# Patient Record
Sex: Male | Born: 1980 | Race: White | Hispanic: No | Marital: Single
Health system: Southern US, Community
[De-identification: ages and names within clinical notes are randomized; demographics above are authoritative.]

## PROBLEM LIST (undated history)

## (undated) DIAGNOSIS — F1010 Alcohol abuse, uncomplicated: Secondary | ICD-10-CM

---

## 2021-05-31 ENCOUNTER — Observation Stay (HOSPITAL_COMMUNITY)
Admission: EM | Admit: 2021-05-31 | Discharge: 2021-06-01 | Disposition: A | Discharge status: Home / Self Care | Attending: Family Medicine | Admitting: Family Medicine

## 2021-05-31 ENCOUNTER — Other Ambulatory Visit: Payer: Self-pay

## 2021-05-31 ENCOUNTER — Emergency Department (HOSPITAL_COMMUNITY)

## 2021-05-31 ENCOUNTER — Encounter (HOSPITAL_COMMUNITY): Payer: Self-pay

## 2021-05-31 DIAGNOSIS — R410 Disorientation, unspecified: Secondary | ICD-10-CM | POA: Diagnosis not present

## 2021-05-31 DIAGNOSIS — W1839XA Other fall on same level, initial encounter: Secondary | ICD-10-CM | POA: Insufficient documentation

## 2021-05-31 DIAGNOSIS — Y92143 Cell of prison as the place of occurrence of the external cause: Secondary | ICD-10-CM | POA: Diagnosis not present

## 2021-05-31 DIAGNOSIS — E876 Hypokalemia: Secondary | ICD-10-CM | POA: Insufficient documentation

## 2021-05-31 DIAGNOSIS — R000 Tachycardia, unspecified: Secondary | ICD-10-CM | POA: Diagnosis not present

## 2021-05-31 DIAGNOSIS — S066X0A Traumatic subarachnoid hemorrhage without loss of consciousness, initial encounter: Principal | ICD-10-CM | POA: Insufficient documentation

## 2021-05-31 DIAGNOSIS — I609 Nontraumatic subarachnoid hemorrhage, unspecified: Secondary | ICD-10-CM | POA: Diagnosis not present

## 2021-05-31 DIAGNOSIS — Z23 Encounter for immunization: Secondary | ICD-10-CM | POA: Insufficient documentation

## 2021-05-31 DIAGNOSIS — F10239 Alcohol dependence with withdrawal, unspecified: Secondary | ICD-10-CM | POA: Diagnosis present

## 2021-05-31 DIAGNOSIS — F10939 Alcohol use, unspecified with withdrawal, unspecified: Secondary | ICD-10-CM | POA: Diagnosis present

## 2021-05-31 DIAGNOSIS — S0990XA Unspecified injury of head, initial encounter: Secondary | ICD-10-CM | POA: Diagnosis present

## 2021-05-31 DIAGNOSIS — F10230 Alcohol dependence with withdrawal, uncomplicated: Secondary | ICD-10-CM

## 2021-05-31 DIAGNOSIS — F1010 Alcohol abuse, uncomplicated: Secondary | ICD-10-CM | POA: Diagnosis present

## 2021-05-31 DIAGNOSIS — F10139 Alcohol abuse with withdrawal, unspecified: Secondary | ICD-10-CM | POA: Insufficient documentation

## 2021-05-31 DIAGNOSIS — Y900 Blood alcohol level of less than 20 mg/100 ml: Secondary | ICD-10-CM | POA: Insufficient documentation

## 2021-05-31 DIAGNOSIS — E669 Obesity, unspecified: Secondary | ICD-10-CM | POA: Insufficient documentation

## 2021-05-31 DIAGNOSIS — Z20822 Contact with and (suspected) exposure to covid-19: Secondary | ICD-10-CM | POA: Diagnosis not present

## 2021-05-31 DIAGNOSIS — F10930 Alcohol use, unspecified with withdrawal, uncomplicated: Secondary | ICD-10-CM

## 2021-05-31 HISTORY — DX: Alcohol abuse, uncomplicated: F10.10

## 2021-05-31 LAB — COMPREHENSIVE METABOLIC PANEL
ALT: 94 U/L — ABNORMAL HIGH (ref 0–44)
AST: 107 U/L — ABNORMAL HIGH (ref 15–41)
Albumin: 4.4 g/dL (ref 3.5–5.0)
Alkaline Phosphatase: 72 U/L (ref 38–126)
Anion gap: 11 (ref 5–15)
BUN: 5 mg/dL — ABNORMAL LOW (ref 6–20)
CO2: 26 mmol/L (ref 22–32)
Calcium: 8.3 mg/dL — ABNORMAL LOW (ref 8.9–10.3)
Chloride: 101 mmol/L (ref 98–111)
Creatinine, Ser: 0.47 mg/dL — ABNORMAL LOW (ref 0.61–1.24)
GFR, Estimated: >60 mL/min (ref >60)
Glucose, Bld: 115 mg/dL — ABNORMAL HIGH (ref 70–99)
Potassium: 3.3 mmol/L — ABNORMAL LOW (ref 3.5–5.1)
Sodium: 138 mmol/L (ref 135–145)
Total Bilirubin: 1.3 mg/dL — ABNORMAL HIGH (ref 0.3–1.2)
Total Protein: 7.7 g/dL (ref 6.5–8.1)

## 2021-05-31 LAB — CBC WITH DIFFERENTIAL/PLATELET
Abs Immature Granulocytes: 0.02 10*3/uL (ref 0.00–0.07)
Basophils Absolute: 0.1 10*3/uL (ref 0.0–0.1)
Basophils Relative: 1 %
Eosinophils Absolute: 0 10*3/uL (ref 0.0–0.5)
Eosinophils Relative: 0 %
HCT: 39.3 % (ref 39.0–52.0)
Hemoglobin: 14 g/dL (ref 13.0–17.0)
Immature Granulocytes: 1 %
Lymphocytes Relative: 16 %
Lymphs Abs: 0.7 10*3/uL (ref 0.7–4.0)
MCH: 32.9 pg (ref 26.0–34.0)
MCHC: 35.6 g/dL (ref 30.0–36.0)
MCV: 92.5 fL (ref 80.0–100.0)
Monocytes Absolute: 0.6 10*3/uL (ref 0.1–1.0)
Monocytes Relative: 14 %
Neutro Abs: 2.9 10*3/uL (ref 1.7–7.7)
Neutrophils Relative %: 68 %
Platelets: 116 10*3/uL — ABNORMAL LOW (ref 150–400)
RBC: 4.25 MIL/uL (ref 4.22–5.81)
RDW: 13 % (ref 11.5–15.5)
WBC: 4.2 10*3/uL (ref 4.0–10.5)
nRBC: 0 % (ref 0.0–0.2)

## 2021-05-31 LAB — RESP PANEL BY RT-PCR (FLU A&B, COVID) ARPGX2
Influenza A by PCR: NEGATIVE
Influenza B by PCR: NEGATIVE
SARS Coronavirus 2 by RT PCR: NEGATIVE

## 2021-05-31 LAB — ETHANOL: Alcohol, Ethyl (B): <10 mg/dL (ref <10)

## 2021-05-31 LAB — MAGNESIUM: Magnesium: 1 mg/dL — ABNORMAL LOW (ref 1.7–2.4)

## 2021-05-31 LAB — PROTIME-INR
INR: 1 (ref 0.8–1.2)
Prothrombin Time: 13.2 seconds (ref 11.4–15.2)

## 2021-05-31 MED ORDER — POTASSIUM CHLORIDE IN NACL 20-0.9 MEQ/L-% IV SOLN
INTRAVENOUS | Status: DC
  Filled 2021-05-31: qty 1000

## 2021-05-31 MED ORDER — LORAZEPAM 1 MG PO TABS: 1.0000 mg | ORAL_TABLET | ORAL | Status: DC | PRN

## 2021-05-31 MED ORDER — THIAMINE HCL 100 MG/ML IJ SOLN: 100.0000 mg | Freq: Every day | INTRAMUSCULAR | Status: DC

## 2021-05-31 MED ORDER — ACETAMINOPHEN 650 MG RE SUPP: 650.0000 mg | Freq: Four times a day (QID) | RECTAL | Status: DC | PRN

## 2021-05-31 MED ORDER — FOLIC ACID 1 MG PO TABS
1.0000 mg | ORAL_TABLET | Freq: Every day | ORAL | Status: DC
  Administered 2021-06-01: 1 mg via ORAL
  Filled 2021-05-31: qty 1

## 2021-05-31 MED ORDER — THIAMINE HCL 100 MG PO TABS
100.0000 mg | ORAL_TABLET | Freq: Every day | ORAL | Status: DC
  Administered 2021-06-01: 100 mg via ORAL
  Filled 2021-05-31: qty 1

## 2021-05-31 MED ORDER — LORAZEPAM 2 MG/ML IJ SOLN
1.0000 mg | INTRAMUSCULAR | Status: DC | PRN
  Administered 2021-06-01: 2 mg via INTRAVENOUS
  Administered 2021-06-01: 4 mg via INTRAVENOUS
  Administered 2021-06-01 (×2): 2 mg via INTRAVENOUS
  Filled 2021-05-31: qty 2
  Filled 2021-05-31: qty 1

## 2021-05-31 MED ORDER — LORAZEPAM 2 MG/ML IJ SOLN
1.0000 mg | Freq: Once | INTRAMUSCULAR | Status: AC
  Administered 2021-05-31: 1 mg via INTRAVENOUS
  Filled 2021-05-31: qty 1

## 2021-05-31 MED ORDER — LORAZEPAM 2 MG/ML IJ SOLN: 0.0000 mg | Freq: Three times a day (TID) | INTRAMUSCULAR | Status: DC

## 2021-05-31 MED ORDER — TETANUS-DIPHTH-ACELL PERTUSSIS 5-2.5-18.5 LF-MCG/0.5 IM SUSY
0.5000 mL | PREFILLED_SYRINGE | Freq: Once | INTRAMUSCULAR | Status: AC
  Administered 2021-05-31: 0.5 mL via INTRAMUSCULAR
  Filled 2021-05-31: qty 0.5

## 2021-05-31 MED ORDER — ACETAMINOPHEN 325 MG PO TABS: 650.0000 mg | ORAL_TABLET | Freq: Four times a day (QID) | ORAL | Status: DC | PRN

## 2021-05-31 MED ORDER — LORAZEPAM 2 MG/ML IJ SOLN
0.0000 mg | INTRAMUSCULAR | Status: DC
  Administered 2021-05-31: 1 mg via INTRAVENOUS
  Filled 2021-05-31 (×3): qty 1

## 2021-05-31 MED ORDER — ADULT MULTIVITAMIN W/MINERALS CH
1.0000 | ORAL_TABLET | Freq: Every day | ORAL | Status: DC
  Administered 2021-06-01: 1 via ORAL
  Filled 2021-05-31: qty 1

## 2021-05-31 MED ORDER — ONDANSETRON HCL 4 MG PO TABS: 4.0000 mg | ORAL_TABLET | Freq: Four times a day (QID) | ORAL | Status: DC | PRN

## 2021-05-31 MED ORDER — POTASSIUM CHLORIDE IN NACL 20-0.9 MEQ/L-% IV SOLN: INTRAVENOUS | Status: DC

## 2021-05-31 MED ORDER — ONDANSETRON HCL 4 MG/2ML IJ SOLN: 4.0000 mg | Freq: Four times a day (QID) | INTRAMUSCULAR | Status: DC | PRN

## 2021-05-31 MED ORDER — POTASSIUM CHLORIDE CRYS ER 20 MEQ PO TBCR
40.0000 meq | EXTENDED_RELEASE_TABLET | Freq: Once | ORAL | Status: AC
  Administered 2021-05-31: 40 meq via ORAL
  Filled 2021-05-31: qty 2

## 2021-05-31 MED ORDER — SODIUM CHLORIDE 0.9 % IV BOLUS
500.0000 mL | Freq: Once | INTRAVENOUS | Status: AC
  Administered 2021-05-31: 500 mL via INTRAVENOUS

## 2021-05-31 NOTE — ED Triage Notes (Signed)
Brought in from [facility] by [facility] for fall from standing height. Nurse at jail reports brief loss of consciousness, but is now alert and oriented x 4. On arrival to ED bleeding controlled by bandage applied by EMS, pt is alert and oriented but tremulous. Pt reports drinking five 24 ounce beers daily with last drink 05/30/2021 at 1700.

## 2021-05-31 NOTE — H&P (Signed)
History and Physical    Stanley Salazar YWV:[id number] DOB: August 02, 1981 DOA: 05/31/2021  PCP: Pcp, No   Patient coming from: Detention center.  I have personally briefly reviewed patient's old medical records in [facility]  Chief Complaint: Fall.  HPI: Stanley Salazar is a 40 y.o. male with medical history significant of of alcohol abuse.  He drinks beer 3 to 6 cans of beer of 24 ounces on weekdays and may be twice as much over the weekends.  Apparently he was arrested for DWI yesterday and taken to the detention center where he had a fall earlier in the evening hitting the back of his head and developing a subarachnoid bleed.  The patient has not had alcohol in over 24 hours, is tremulous, confused about time and date and restless.  He is unable to provide further history.  ED Course: Initial vital signs were 99.7 F, pulse 104, respirations 20, BP 174/107 mmHg O2 sat 100% on room air.  The patient received 1 mg of lorazepam, 500 mL NS bolus, a Tdap booster.  I added lorazepam 2 mg IVP x1, magnesium sulfate 4 g IVPB, metoprolol 5 mg IVP x1 and 5mg  haloperidol IVP times  Lab work: His CBC showed white count 4.2, hemoglobin 14.0 g/dL platelets .  PT was 13.2 INR 1.0.  CMP showed a potassium of 3.3 mmol/L, normal sodium, chloride and CO2.  Calcium 8.3, glucose 115, BUN 5 and creatinine 0.47 mg/dL.  LFTs show an AST of 107 and ALT of 94 units/L.  Total bilirubin was 1.3 mg/dL.  The rest of the hepatic functions were normal.  Alcohol level was less than 10 mg/dL.  Imaging: CT cervical spine did not show any fractures.  CT head with contrast showed a left parietal hematoma and frontal area subarachnoid hemorrhage.  Please see images and phonated report for further detail.  Review of Systems: As per HPI otherwise all other systems reviewed and are negative.  History reviewed. No pertinent past medical history.  History reviewed. No pertinent surgical history.  Social History  reports current  alcohol use of about 5.0 standard drinks of alcohol per week. No history on file for tobacco use and drug use.  No Known Allergies  Family history. Stated he has some relatives with hypertension and diabetes.  Prior to Admission medications   Not on File    Physical Exam: Vitals:   05/31/21 1952 05/31/21 2046 05/31/21 2200 05/31/21 2207  BP:  (!) 147/107 (!) 163/97 (!) 163/97  Pulse:  (!) 104 76 76  Resp:   (!) 22   Temp:      TempSrc:      SpO2:   100%   Weight: 81.6 kg     Height: 5\' 2"  (1.575 m)      Constitutional: Moderately restless and anxious. Eyes: PERRL, lids and conjunctivae normal.  Sclerae is injected. ENMT: Mucous membranes are moist. Posterior pharynx clear of any exudate or lesions. Neck: normal, supple, no masses, no thyromegaly Respiratory: clear to auscultation bilaterally, no wheezing, no crackles. Normal respiratory effort. No accessory muscle use.  Cardiovascular: Sinus tachycardia in the 110s, no murmurs / rubs / gallops. No extremity edema. 2+ pedal pulses. No carotid bruits.  Abdomen: Obese, no distention.  Bowel sounds positive. Soft, no tenderness, no masses palpated. No hepatosplenomegaly.  Musculoskeletal: no clubbing / cyanosis.  Good ROM, no contractures. Normal muscle tone.  Skin: Left parietal hematoma with 3 to 4 cm long laceration. Neurologic: Restless and tremulous, positive tongue  fasciculations, grossly nonfocal. Psychiatric: Oriented x2, partially disoriented to time/date and situation.  Labs on Admission: I have personally reviewed following labs and imaging studies  CBC: Recent Labs  Lab 05/31/21 2105  WBC 4.2  NEUTROABS 2.9  HGB 14.0  HCT 39.3  MCV 92.5  PLT 116*    Basic Metabolic Panel: Recent Labs  Lab 05/31/21 2105  NA 138  K 3.3*  CL 101  CO2 26  GLUCOSE 115*  BUN 5*  CREATININE 0.47*  CALCIUM 8.3*  MG 1.0*    GFR: Estimated Creatinine Clearance: 113.5 mL/min (A) (by C-G formula based on SCr of 0.47 mg/dL  (L)).  Liver Function Tests: Recent Labs  Lab 05/31/21 2105  AST 107*  ALT 94*  ALKPHOS 72  BILITOT 1.3*  PROT 7.7  ALBUMIN 4.4    Urine analysis: No results found for: COLORURINE, APPEARANCEUR, LABSPEC, PHURINE, GLUCOSEU, HGBUR, BILIRUBINUR, KETONESUR, PROTEINUR, UROBILINOGEN, NITRITE, LEUKOCYTESUR  Radiological Exams on Admission: CT HEAD WO CONTRAST ( )  Result Date: 05/31/2021 CLINICAL DATA:  Fall EXAM: CT HEAD WITHOUT CONTRAST TECHNIQUE: Contiguous axial images were obtained from the base of the skull through the vertex without intravenous contrast. COMPARISON:  None. FINDINGS: Brain: There is small amount of subarachnoid blood noted medially in both frontal lobes adjacent to the falx, best seen on axial image 21, series 2 and coronal image 33, series 4. No mass effect or midline shift. No hydrocephalus. Vascular: No hyperdense vessel or unexpected calcification. Skull: No acute calvarial abnormality. Sinuses/Orbits: No acute findings Other: Large hematoma over the left parietal region. IMPRESSION: Small amount of subarachnoid blood within the medial frontal lobes bilaterally. Critical Value/emergent results were called by telephone at the time of interpretation on 05/31/2021 at 8:49 pm to provider JOSEPH ZAMMIT , who verbally acknowledged these results. Electronically Signed   By: Charlett Nose M.D.   On: 05/31/2021 20:50   CT Cervical Spine Wo Contrast  Result Date: 05/31/2021 CLINICAL DATA:  Neck trauma, dangerous injury mechanism (Age 72-64y). Fall. EXAM: CT CERVICAL SPINE WITHOUT CONTRAST TECHNIQUE: Multidetector CT imaging of the cervical spine was performed without intravenous contrast. Multiplanar CT image reconstructions were also generated. COMPARISON:  None. FINDINGS: Alignment: Normal Skull base and vertebrae: No acute fracture. No primary bone lesion or focal pathologic process. Soft tissues and spinal canal: No prevertebral fluid or swelling. No visible canal hematoma. Disc  levels:  Normal Upper chest: Negative Other: None IMPRESSION: No bony abnormality. Electronically Signed   By: Charlett Nose M.D.   On: 05/31/2021 20:52    EKG: Independently reviewed.  Vent. rate 76 BPM PR interval 142 ms QRS duration 88 ms QT/QTcB 402/452 ms P-R-T axes 69 64 35 Normal sinus rhythm Normal ECG  Assessment/Plan Principal Problem:   Subarachnoid bleed (HCC) Observation/stepdown. Neurochecks every 2 hours. Repeat CT head in a.m. Neurosurgery will compared to yesterday's CT.  Active Problems:   Alcohol abuse   Alcohol withdrawal (HCC) Begin CIWA protocol. Magnesium sulfate given. Continue thiamine, MVI and folate. Consult TOC team.    Hypokalemia Replacing. Magnesium was supplemented. Follow-up potassium level.    Hypomagnesemia Secondary to heavy alcohol consumption. Magnesium sulfate 4 g IVPB x1 dose infused.    Class 1 obesity Lifestyle modifications. Needs PCP for future follow-up.   DVT prophylaxis: SCDs. Code Status:   Full code. Family Communication:   Disposition Plan:   Patient is from:  Detention center.  Anticipated DC to:  TBD.  Anticipated DC date:  06/02/2021 or 06/03/2021.  Anticipated DC barriers:  Clinical status. Consults called:   Admission status:  Observation/stepdown.  Severity of Illness:  High severity after presenting with history of fall that resulted on a subarachnoid bleed and external left parietal hematoma.  The patient is also having alcohol withdrawal with confusion.  He will remain for close neurological monitoring, repeat head CT in a.m., electrolyte/nutrient replacement and alcohol detoxification.  Bobette Mo MD [facility]  How to contact the [facility] Attending or Consulting provider 7A - 7P or covering provider during after hours 7P -7A, for this patient?   Check the care team in [facility] and look for a) attending/consulting [facility] provider listed and b) the [facility] team listed Log into [email or web address] and use  [place]'s universal password to access. If you do not have the password, please contact the hospital operator. Locate the [facility] provider you are looking for under [facility] and page to a number that you can be directly reached. If you still have difficulty reaching the provider, please page the [facility] (Director on Call) for the Hospitalists listed on amion for assistance.  05/31/2021, 11:31 PM   This document was prepared using Dragon voice recognition software and may contain some unintended transcription errors.

## 2021-05-31 NOTE — ED Provider Notes (Signed)
[facility] EMERGENCY DEPARTMENT Provider Note   CSN: [id number] Arrival date & time: 05/31/21  1934     History No chief complaint on file.   Stanley Salazar is a 40 y.o. male.  HPI  HPI was collected while using Spanish interpreter.  Patient with no significant medical history presents to the emergency department via [facility] jail due to a fall.  Patient states he was standing in his jail cell and passed out, he states he struck the left side of his head is now having a slight headache but denies change in vision, paresthesias or weakness in the upper and or lower extremities, denies lightheaded or dizziness, nausea or vomiting.  Patient states he is unsure whether he lost conscious, he is not on anticoagulant, he denies feeling chest pain, shortness of breath, lightheaded or dizziness prior to passing out.  He states he is never passed out in the past.  He endorses that he drinks 3-24 ounce cans of alcohol daily, states his last drink was yesterday, he states he feels slightly anxious, but denies homicidal/ suicidal ideations, denies hallucination delusions.  He denies  neck pain, back pain, chest pain, abdominal pain, has no other complaints at this time.    History reviewed. No pertinent past medical history.  Patient Active Problem List   Diagnosis Date Noted   Subarachnoid bleed (HCC) 05/31/2021   Alcohol withdrawal (HCC) 05/31/2021    History reviewed. No pertinent surgical history.     History reviewed. No pertinent family history.  Social History   Tobacco Use   Smoking status: Unknown  Substance Use Topics   Alcohol use: Yes    Alcohol/week: 5.0 standard drinks    Types: 5 Cans of beer per week    Comment: 5- 24 oucnc beers/day    Home Medications Prior to Admission medications   Not on File    Allergies    Patient has no known allergies.  Review of Systems   Review of Systems  Constitutional:  Negative for chills and fever.  HENT:  Negative  for congestion.   Eyes:  Negative for visual disturbance.  Respiratory:  Negative for shortness of breath.   Cardiovascular:  Negative for chest pain.  Gastrointestinal:  Negative for abdominal pain.  Genitourinary:  Negative for enuresis.  Musculoskeletal:  Negative for back pain.  Skin:  Positive for wound. Negative for rash.  Neurological:  Positive for headaches. Negative for dizziness.  Hematological:  Does not bruise/bleed easily.  Psychiatric/Behavioral:  The patient is nervous/anxious.    Physical Exam Updated Vital Signs BP (!) 163/97   Pulse 76   Temp 99.7 F (37.6 C) (Oral)   Resp (!) 22   Ht 5\' 2"  (1.575 m)   Wt 81.6 kg   SpO2 100%   BMI 32.92 kg/m   Physical Exam Vitals and nursing note reviewed.  Constitutional:      General: He is not in acute distress.    Appearance: He is not ill-appearing.  HENT:     Head: Normocephalic and atraumatic.     Comments: Head was visualized he has a noted hematoma on the left occipital lobe, he has a 4 cm laceration, hemodynamically stable, approximately 2 mm in depth no foreign bodies present.  No other gross abnormalities present.    Nose: No congestion.  Eyes:     Extraocular Movements: Extraocular movements intact.     Conjunctiva/sclera: Conjunctivae normal.     Pupils: Pupils are equal, round, and reactive to light.  Cardiovascular:     Rate and Rhythm: Normal rate and regular rhythm.     Pulses: Normal pulses.     Heart sounds: No murmur heard.   No friction rub. No gallop.  Pulmonary:     Effort: No respiratory distress.     Breath sounds: No wheezing, rhonchi or rales.  Chest:     Chest wall: No tenderness.  Abdominal:     Palpations: Abdomen is soft.     Tenderness: There is no abdominal tenderness.  Musculoskeletal:     Cervical back: Neck supple.     Comments: Patient has 5 of 5 strength, full range of motion in the upper and lower extremities.  Spine was palpated is nontender to palpation, no step-off  or deformities present.  Skin:    General: Skin is warm and dry.  Neurological:     Mental Status: He is alert.     GCS: GCS eye subscore is 4. GCS verbal subscore is 5. GCS motor subscore is 6.     Motor: Motor function is intact. No weakness.     Comments: Cranial nerves II through XII grossly intact patient is having no difficulty word finding, no slurring of his words, able to follow two-step commands, no unilateral weakness present my exam.  It is noted that patient has bilateral tremors likely from alcohol withdrawals.  Psychiatric:        Mood and Affect: Mood normal.    ED Results / Procedures / Treatments   Labs (all labs ordered are listed, but only abnormal results are displayed) Labs Reviewed  CBC WITH DIFFERENTIAL/PLATELET - Abnormal; Notable for the following components:      Result Value   Platelets 116 (*)    All other components within normal limits  COMPREHENSIVE METABOLIC PANEL - Abnormal; Notable for the following components:   Potassium 3.3 (*)    Glucose, Bld 115 (*)    BUN 5 (*)    Creatinine, Ser 0.47 (*)    Calcium 8.3 (*)    AST 107 (*)    ALT 94 (*)    Total Bilirubin 1.3 (*)    All other components within normal limits  RESP PANEL BY RT-PCR (FLU A&B, COVID) ARPGX2  ETHANOL  PROTIME-INR  COMPREHENSIVE METABOLIC PANEL  MAGNESIUM  PHOSPHORUS  HIV ANTIBODY (ROUTINE TESTING W REFLEX)    EKG None  Radiology CT HEAD WO CONTRAST (5MM)  Result Date: 05/31/2021 CLINICAL DATA:  Fall EXAM: CT HEAD WITHOUT CONTRAST TECHNIQUE: Contiguous axial images were obtained from the base of the skull through the vertex without intravenous contrast. COMPARISON:  None. FINDINGS: Brain: There is small amount of subarachnoid blood noted medially in both frontal lobes adjacent to the falx, best seen on axial image 21, series 2 and coronal image 33, series 4. No mass effect or midline shift. No hydrocephalus. Vascular: No hyperdense vessel or unexpected calcification.  Skull: No acute calvarial abnormality. Sinuses/Orbits: No acute findings Other: Large hematoma over the left parietal region. IMPRESSION: Small amount of subarachnoid blood within the medial frontal lobes bilaterally. Critical Value/emergent results were called by telephone at the time of interpretation on 05/31/2021 at 8:49 pm to provider JOSEPH ZAMMIT , who verbally acknowledged these results. Electronically Signed   By: Charlett NoseKevin  Dover M.D.   On: 05/31/2021 20:50   CT Cervical Spine Wo Contrast  Result Date: 05/31/2021 CLINICAL DATA:  Neck trauma, dangerous injury mechanism (Age 2-64y). Fall. EXAM: CT CERVICAL SPINE WITHOUT CONTRAST TECHNIQUE: Multidetector CT imaging of  the cervical spine was performed without intravenous contrast. Multiplanar CT image reconstructions were also generated. COMPARISON:  None. FINDINGS: Alignment: Normal Skull base and vertebrae: No acute fracture. No primary bone lesion or focal pathologic process. Soft tissues and spinal canal: No prevertebral fluid or swelling. No visible canal hematoma. Disc levels:  Normal Upper chest: Negative Other: None IMPRESSION: No bony abnormality. Electronically Signed   By: Charlett Nose M.D.   On: 05/31/2021 20:52    Procedures .Marland KitchenLaceration Repair  Date/Time: 05/31/2021 11:01 PM Performed by: Carroll Sage, PA-C Authorized by: Carroll Sage, PA-C   Consent:    Consent obtained:  Verbal   Consent given by:  Patient   Risks discussed:  Infection, pain, retained foreign body, need for additional repair, poor cosmetic result, tendon damage, vascular damage, poor wound healing and nerve damage   Alternatives discussed:  No treatment Universal protocol:    Patient identity confirmed:  Verbally with patient Anesthesia:    Anesthesia method:  None Laceration details:    Location:  Scalp   Scalp location:  Occipital   Length (cm):  4   Depth (mm):  2 Pre-procedure details:    Preparation:  Patient was prepped and draped in  usual sterile fashion Exploration:    Limited defect created (wound extended): no     Hemostasis achieved with:  Direct pressure   Wound exploration: wound explored through full range of motion and entire depth of wound visualized     Contaminated: no   Treatment:    Amount of cleaning:  Standard   Irrigation solution:  Sterile saline   Irrigation method:  Pressure wash   Visualized foreign bodies/material removed: no     Debridement:  None Skin repair:    Repair method:  Staples   Number of staples:  2 Approximation:    Approximation:  Close Repair type:    Repair type:  Simple Post-procedure details:    Dressing:  Non-adherent dressing   Procedure completion:  Tolerated well, no immediate complications   Medications Ordered in ED Medications  Tdap (BOOSTRIX) injection 0.5 mL (has no administration in time range)  LORazepam (ATIVAN) tablet 1-4 mg (has no administration in time range)    Or  LORazepam (ATIVAN) injection 1-4 mg (has no administration in time range)  thiamine tablet 100 mg (has no administration in time range)    Or  thiamine (B-1) injection 100 mg (has no administration in time range)  folic acid (FOLVITE) tablet 1 mg (has no administration in time range)  multivitamin with minerals tablet 1 tablet (has no administration in time range)  LORazepam (ATIVAN) injection 0-4 mg (has no administration in time range)    Followed by  LORazepam (ATIVAN) injection 0-4 mg (has no administration in time range)  acetaminophen (TYLENOL) tablet 650 mg (has no administration in time range)    Or  acetaminophen (TYLENOL) suppository 650 mg (has no administration in time range)  ondansetron (ZOFRAN) tablet 4 mg (has no administration in time range)    Or  ondansetron (ZOFRAN) injection 4 mg (has no administration in time range)  potassium chloride SA (KLOR-CON) CR tablet 40 mEq (has no administration in time range)  0.9 % NaCl with KCl 20 mEq/ L  infusion (has no  administration in time range)  sodium chloride 0.9 % bolus 500 mL (0 mLs Intravenous Stopped 05/31/21 2205)  LORazepam (ATIVAN) injection 1 mg (1 mg Intravenous Given 05/31/21 2108)    ED Course  I have reviewed  the triage vital signs and the nursing notes.  Pertinent labs & imaging results that were available during my care of the patient were reviewed by me and considered in my medical decision making (see chart for details).    MDM Rules/Calculators/A&P                          Initial impression-patient presents after a fall, he is alert, does not appear to be in acute distress, vital signs show tachycardia, concern for head injury as well as alcohol withdrawals will obtain lab work initiate CIWA continue to evaluate.  Work-up-CBC unremarkable, CMP shows slight hypokalemia 3.3, hyperglycemia 115, elevated liver enzymes AST 100, ALT 94, T bili 1.3 INR unremarkable, CT head reveals small amount of subarachnoid blood within the medial frontal lobes bilaterally, CT C-spine negative for acute findings.  Reassessment-patient initial CIWA of 19, given 1 mg of Ativan, patient was reassessed tachycardia has improved but continues have bilateral tremors.  Notified that patient has subarachnoid bleed will consult with neurosurgery for further evaluation.  Updated patient on recommendation from neurosurgery, he is in agreement with this plan, will consult hospitalist for admission.  Inform patient that he has a small laceration on his head will recommend staples for improved wound healing.  Patient agreeable this plan.  Patient tolerated procedure well.  Consult   1.  Spoke with Dr. Franky Macho of neurosurgery, he recommends observation, repeat CT head, notify him if there are new neurodeficits or acute findings seen on imaging.  2.  Spoke with Dr. Robb Matar who will admit the patient.  Rule out-low suspicion for CVA as there is no neurodeficits present my exam.  Low suspicion for spinal cord abnormality  or spinal cord the spine was palpated was nontender to palpation, patient has full range of motion in the upper and lower extremities.  Low suspicion for pneumothorax or rib fracture as ribs are nontender to palpation, lung sounds are clear bilaterally, will defer imaging at this time.  Low suspicion for intra-abdominal trauma as abdomen soft nontender to palpation.  It is noted that patient has an elevated liver enzymes as well as slight elevated T bili he has no right upper quadrant tenderness, I doubt gallbladder abnormality or liver cirrhosis.  Likely this is secondary due to alcohol consumption.  Plan-admit due to subarachnoid hemorrhage as well as uncomplicated alcohol withdrawals.  Final Clinical Impression(s) / ED Diagnoses Final diagnoses:  Subarachnoid bleed (HCC)  Alcohol withdrawal syndrome without complication [facility])    Rx / DC Orders ED Discharge Orders     None        Barnie Del 05/31/21 2303    Bethann Berkshire, MD 06/07/21 [phone]

## 2021-06-01 ENCOUNTER — Observation Stay (HOSPITAL_COMMUNITY)

## 2021-06-01 ENCOUNTER — Encounter (HOSPITAL_COMMUNITY): Payer: Self-pay | Admitting: Internal Medicine

## 2021-06-01 DIAGNOSIS — I609 Nontraumatic subarachnoid hemorrhage, unspecified: Secondary | ICD-10-CM | POA: Diagnosis not present

## 2021-06-01 DIAGNOSIS — F1010 Alcohol abuse, uncomplicated: Secondary | ICD-10-CM | POA: Diagnosis not present

## 2021-06-01 DIAGNOSIS — E876 Hypokalemia: Secondary | ICD-10-CM

## 2021-06-01 DIAGNOSIS — F10230 Alcohol dependence with withdrawal, uncomplicated: Secondary | ICD-10-CM | POA: Diagnosis not present

## 2021-06-01 DIAGNOSIS — E669 Obesity, unspecified: Secondary | ICD-10-CM | POA: Diagnosis present

## 2021-06-01 LAB — MRSA NEXT GEN BY PCR, NASAL: MRSA by PCR Next Gen: NOT DETECTED

## 2021-06-01 LAB — COMPREHENSIVE METABOLIC PANEL
ALT: 81 U/L — ABNORMAL HIGH (ref 0–44)
AST: 98 U/L — ABNORMAL HIGH (ref 15–41)
Albumin: 4.1 g/dL (ref 3.5–5.0)
Alkaline Phosphatase: 66 U/L (ref 38–126)
Anion gap: 10 (ref 5–15)
BUN: 5 mg/dL — ABNORMAL LOW (ref 6–20)
CO2: 25 mmol/L (ref 22–32)
Calcium: 8.1 mg/dL — ABNORMAL LOW (ref 8.9–10.3)
Chloride: 102 mmol/L (ref 98–111)
Creatinine, Ser: 0.44 mg/dL — ABNORMAL LOW (ref 0.61–1.24)
GFR, Estimated: >60 mL/min (ref >60)
Glucose, Bld: 101 mg/dL — ABNORMAL HIGH (ref 70–99)
Potassium: 3.2 mmol/L — ABNORMAL LOW (ref 3.5–5.1)
Sodium: 137 mmol/L (ref 135–145)
Total Bilirubin: 1.3 mg/dL — ABNORMAL HIGH (ref 0.3–1.2)
Total Protein: 7.3 g/dL (ref 6.5–8.1)

## 2021-06-01 LAB — MAGNESIUM: Magnesium: 2.5 mg/dL — ABNORMAL HIGH (ref 1.7–2.4)

## 2021-06-01 LAB — CBG MONITORING, ED: Glucose-Capillary: 100 mg/dL — ABNORMAL HIGH (ref 70–99)

## 2021-06-01 LAB — PHOSPHORUS: Phosphorus: 2.4 mg/dL — ABNORMAL LOW (ref 2.5–4.6)

## 2021-06-01 LAB — HIV ANTIBODY (ROUTINE TESTING W REFLEX): HIV Screen 4th Generation wRfx: NONREACTIVE

## 2021-06-01 LAB — GLUCOSE, CAPILLARY: Glucose-Capillary: 143 mg/dL — ABNORMAL HIGH (ref 70–99)

## 2021-06-01 MED ORDER — FOLIC ACID 1 MG PO TABS: 1.0000 mg | ORAL_TABLET | Freq: Every day | ORAL | 2 refills | Status: AC

## 2021-06-01 MED ORDER — LABETALOL HCL 5 MG/ML IV SOLN: 20.0000 mg | INTRAVENOUS | Status: DC | PRN

## 2021-06-01 MED ORDER — ATENOLOL 25 MG PO TABS: 25.0000 mg | ORAL_TABLET | Freq: Every day | ORAL | 1 refills | Status: AC

## 2021-06-01 MED ORDER — POTASSIUM CHLORIDE IN NACL 20-0.9 MEQ/L-% IV SOLN: INTRAVENOUS | Status: DC

## 2021-06-01 MED ORDER — CHLORHEXIDINE GLUCONATE CLOTH 2 % EX PADS
6.0000 | MEDICATED_PAD | Freq: Every day | CUTANEOUS | Status: DC
  Administered 2021-06-01: 6 via TOPICAL

## 2021-06-01 MED ORDER — METOPROLOL TARTRATE 5 MG/5ML IV SOLN: 5.0000 mg | Freq: Four times a day (QID) | INTRAVENOUS | Status: DC

## 2021-06-01 MED ORDER — MAGNESIUM SULFATE 4 GM/100ML IV SOLN
4.0000 g | Freq: Once | INTRAVENOUS | Status: AC
  Administered 2021-06-01: 4 g via INTRAVENOUS
  Filled 2021-06-01: qty 100

## 2021-06-01 MED ORDER — POTASSIUM CHLORIDE CRYS ER 20 MEQ PO TBCR
40.0000 meq | EXTENDED_RELEASE_TABLET | Freq: Once | ORAL | Status: AC
  Administered 2021-06-01: 40 meq via ORAL
  Filled 2021-06-01: qty 2

## 2021-06-01 MED ORDER — THIAMINE HCL 100 MG PO TABS: 100.0000 mg | ORAL_TABLET | Freq: Every day | ORAL | 2 refills | Status: AC

## 2021-06-01 MED ORDER — METOPROLOL TARTRATE 5 MG/5ML IV SOLN
5.0000 mg | Freq: Once | INTRAVENOUS | Status: AC
  Administered 2021-06-01: 5 mg via INTRAVENOUS
  Filled 2021-06-01: qty 5

## 2021-06-01 MED ORDER — HALOPERIDOL LACTATE 5 MG/ML IJ SOLN
5.0000 mg | Freq: Four times a day (QID) | INTRAMUSCULAR | Status: DC | PRN
Start: 2021-06-01 — End: 2021-06-01
  Filled 2021-06-01: qty 1

## 2021-06-01 MED ORDER — CHLORDIAZEPOXIDE HCL 10 MG PO CAPS: 10.0000 mg | ORAL_CAPSULE | Freq: Three times a day (TID) | ORAL | 0 refills | Status: AC | PRN

## 2021-06-01 MED ORDER — ADULT MULTIVITAMIN W/MINERALS CH: 1.0000 | ORAL_TABLET | Freq: Every day | ORAL | 2 refills | Status: AC

## 2021-06-01 NOTE — Discharge Summary (Signed)
Physician Discharge Summary  Javin Nong ZOX:[id number] DOB: Oct 20, 1981 DOA: 05/31/2021  Neurosurgeon: Dr. Franky Macho   Admit date: 05/31/2021 Discharge date: 06/01/2021  Disposition:  Home   Recommendations for Outpatient Follow-up:  Please establish care with PCP in 1-2 weeks Please follow up with Dr. Franky Macho in 2 weeks for neurosurgery follow up STAPLES TO BE REMOVED IN 7-10 days,  can return to ER or go to PCP office or urgent care  Discharge Condition: STABLE   CODE STATUS: FULL DIET: Heart healthy recommended    Brief Hospitalization Summary: Please see all hospital notes, images, labs for full details of the hospitalization. ADMISSION  HPI:   Stanley Salazar is a 40 y.o. male with medical history significant of of alcohol abuse.  He drinks beer 3 to 6 cans of beer of 24 ounces on weekdays and may be twice as much over the weekends.  Apparently he was arrested for DWI yesterday and taken to the detention center where he had a fall earlier in the evening hitting the back of his head and developing a subarachnoid bleed.  The patient has not had alcohol in over 24 hours, is tremulous, confused about time and date and restless.  He is unable to provide further history.   ED Course: Initial vital signs were 99.7 F, pulse 104, respirations 20, BP 174/107 mmHg O2 sat 100% on room air.  The patient received 1 mg of lorazepam, 500 mL NS bolus, a Tdap booster.  I added lorazepam 2 mg IVP x1, magnesium sulfate 4 g IVPB, metoprolol 5 mg IVP x1 and  haloperidol IVP times   Lab work: His CBC showed white count 4.2, hemoglobin 14.0 g/dL platelets 811.  PT was 13.2 INR 1.0.  CMP showed a potassium of 3.3 mmol/L, normal sodium, chloride and CO2.  Calcium 8.3, glucose 115, BUN 5 and creatinine 0.47 mg/dL.  LFTs show an AST of 107 and ALT of 94 units/L.  Total bilirubin was 1.3 mg/dL.  The rest of the hepatic functions were normal.  Alcohol level was less than 10 mg/dL.   Imaging: CT cervical spine did not show  any fractures.  CT head with contrast showed a left parietal hematoma and frontal area subarachnoid hemorrhage.  Please see images and phonated report for further detail.  Hospital Course  Patient was admitted for overnight observation and repeat CT head imaging.  This was performed on 06/01/2021 and the results were reviewed with neurosurgeon Dr. Franky Macho.  He said that there was no need for further CT imaging at this time and patient could discharge home and have outpatient follow-up with Dr. Franky Macho in 2 weeks.  Patient has had serial neurochecks and has remained stable monitored in the ICU.  He received IV fluids and vitamin supplementation and was on the CIWA protocol.  He is feeling much better.  He is having on neurological signs or symptoms at this time.  He will be discharged home to have outpatient follow-up.  Patient was advised to have staples removed in 7 to 10 days.  He can come to the emergency department or go to a PCP clinic or urgent care clinic to have staples removed.  Patient verbalized understanding.  A qualified Spanish interpreter was utilized to communicate with patient.  Patient has a history of chronic alcohol abuse and was counseled and a TOC consultation was requested as well for substance abuse counseling and resources and for helping him to establish a primary care provider.  He was given a prescription for  atenolol 25 mg daily to start treatment for blood pressure elevation.  In addition he was given prescription for supplemental vitamins for alcohol abuse and Librium tablets to assist him with symptoms of alcohol withdrawal.  Patient was strongly advised that he should not drink alcohol while taking Librium.  He verbalized understanding.  Discharge Diagnoses:  Principal Problem:   Subarachnoid bleed (HCC) Active Problems:   Alcohol withdrawal (HCC)   Alcohol abuse   Class 1 obesity   Hypokalemia   Hypomagnesemia   Discharge Instructions:  Allergies as of 06/01/2021   No  Known Allergies      Medication List     TAKE these medications    atenolol 25 MG tablet Commonly known as: Tenormin Take 1 tablet (25 mg total) by mouth daily.   chlordiazePOXIDE 10 MG capsule Commonly known as: LIBRIUM Take 1 capsule (10 mg total) by mouth 3 (three) times daily as needed for anxiety or withdrawal.   folic acid 1 MG tablet Commonly known as: FOLVITE Take 1 tablet (1 mg total) by mouth daily. Start taking on: June 02, 2021   multivitamin with minerals Tabs tablet Take 1 tablet by mouth daily. Start taking on: June 02, 2021   thiamine 100 MG tablet Take 1 tablet (100 mg total) by mouth daily. Start taking on: June 02, 2021        Follow-up Information     Coletta Memos, MD. Schedule an appointment as soon as possible for a visit in 2 week(s).   Specialty: Neurosurgery Why: Hospital Follow Up Contact information: [address] [place] [address] [phone]                No Known Allergies Allergies as of 06/01/2021   No Known Allergies      Medication List     TAKE these medications    atenolol 25 MG tablet Commonly known as: Tenormin Take 1 tablet (25 mg total) by mouth daily.   chlordiazePOXIDE 10 MG capsule Commonly known as: LIBRIUM Take 1 capsule (10 mg total) by mouth 3 (three) times daily as needed for anxiety or withdrawal.   folic acid 1 MG tablet Commonly known as: FOLVITE Take 1 tablet (1 mg total) by mouth daily. Start taking on: June 02, 2021   multivitamin with minerals Tabs tablet Take 1 tablet by mouth daily. Start taking on: June 02, 2021   thiamine 100 MG tablet Take 1 tablet (100 mg total) by mouth daily. Start taking on: June 02, 2021        Procedures/Studies: CT HEAD WO CONTRAST ( )  Addendum Date: 06/01/2021   ADDENDUM REPORT: 06/01/2021 09:21 ADDENDUM: Study discussed by telephone with Dr. Laural Benes on 06/01/2021 at 0916 hours. Electronically Signed   By: Odessa Fleming  M.D.   On: 06/01/2021 09:21   Result Date: 06/01/2021 CLINICAL DATA:  40 year old male status post fall with trace subarachnoid hemorrhage along the superior convexity. EXAM: CT HEAD WITHOUT CONTRAST TECHNIQUE: Contiguous axial images were obtained from the base of the skull through the vertex without intravenous contrast. COMPARISON:  Head CT 05/31/2021. FINDINGS: Brain: Small mostly low-density hemispheric extra-axial collections have developed since yesterday greater on the left (coronal image 29) measuring 3-4 mm thick on that side. No para falcine or tentorial blood is identified. Trace subarachnoid hemorrhage has regressed in the bilateral marginal sulci, but not fully resolved. No intraventricular hemorrhage. Basilar cisterns remain normal. There is trace rightward midline shift now related to the extra-axial fluid (  series 2, image 20). No mass effect on the ventricles. No ventriculomegaly. No cortically based acute infarct identified. Gray-white matter differentiation remains within normal limits. Vascular: Minimal Calcified atherosclerosis at the skull base. No suspicious intracranial vascular hyperdensity. Skull: No fracture identified. Sinuses/Orbits: Visualized paranasal sinuses and mastoids are stable and well aerated. Other: Scalp hematoma eccentric to the left is now more broad-based. Skin staples in place along the left posterior convexity. Orbits soft tissues appear to remain within normal limits. IMPRESSION: 1. Trace bilateral subdural hematomas have developed since yesterday, mostly hypodense and larger on the Left. No significant intracranial mass effect. 2. Trace subarachnoid hemorrhage has regressed since yesterday. 3. Otherwise negative noncontrast CT appearance of the brain. 4. Broad-based left scalp hematoma.  No skull fracture identified. Electronically Signed: By: Odessa Fleming M.D. On: 06/01/2021 09:09   CT HEAD WO CONTRAST ( )  Result Date: 05/31/2021 CLINICAL DATA:  Fall EXAM: CT HEAD  WITHOUT CONTRAST TECHNIQUE: Contiguous axial images were obtained from the base of the skull through the vertex without intravenous contrast. COMPARISON:  None. FINDINGS: Brain: There is small amount of subarachnoid blood noted medially in both frontal lobes adjacent to the falx, best seen on axial image 21, series 2 and coronal image 33, series 4. No mass effect or midline shift. No hydrocephalus. Vascular: No hyperdense vessel or unexpected calcification. Skull: No acute calvarial abnormality. Sinuses/Orbits: No acute findings Other: Large hematoma over the left parietal region. IMPRESSION: Small amount of subarachnoid blood within the medial frontal lobes bilaterally. Critical Value/emergent results were called by telephone at the time of interpretation on 05/31/2021 at 8:49 pm to provider JOSEPH ZAMMIT , who verbally acknowledged these results. Electronically Signed   By: Charlett Nose M.D.   On: 05/31/2021 20:50   CT Cervical Spine Wo Contrast  Result Date: 05/31/2021 CLINICAL DATA:  Neck trauma, dangerous injury mechanism (Age 75-64y). Fall. EXAM: CT CERVICAL SPINE WITHOUT CONTRAST TECHNIQUE: Multidetector CT imaging of the cervical spine was performed without intravenous contrast. Multiplanar CT image reconstructions were also generated. COMPARISON:  None. FINDINGS: Alignment: Normal Skull base and vertebrae: No acute fracture. No primary bone lesion or focal pathologic process. Soft tissues and spinal canal: No prevertebral fluid or swelling. No visible canal hematoma. Disc levels:  Normal Upper chest: Negative Other: None IMPRESSION: No bony abnormality. Electronically Signed   By: Charlett Nose M.D.   On: 05/31/2021 20:52     Subjective: Pt without any specific complaints.  No vision changes, no focal weakness, he is agreeable to following up outpatient as recommended with primary care and neurosurgery.    Discharge Exam: Vitals:   06/01/21 0757 06/01/21 1100  BP:    Pulse: 92   Resp: 17   Temp:  99.4 F (37.4 C) 99.2 F (37.3 C)  SpO2: 96%    Vitals:   06/01/21 0500 06/01/21 0600 06/01/21 0757 06/01/21 1100  BP: (!) 150/119 (!) 154/92    Pulse: 94 92 92   Resp: 17 15 17    Temp:   99.4 F (37.4 C) 99.2 F (37.3 C)  TempSrc:   Axillary   SpO2: 96% 96% 96%   Weight:      Height:       General: Head laceration with staples in place appears healing well. Pt is alert, awake, not in acute distress Cardiovascular: normal S1/S2 +, no rubs, no gallops Respiratory: CTA bilaterally, no wheezing, no rhonchi Abdominal: Soft, NT, ND, bowel sounds + Extremities: no edema, no cyanosis Neurological: nonfocal exam.  The results of significant diagnostics from this hospitalization (including imaging, microbiology, ancillary and laboratory) are listed below for reference.     Microbiology: Recent Results (from the past 240 hour(s))  Resp Panel by RT-PCR (Flu A&B, Covid) Nasopharyngeal Swab     Status: None   Collection Time: 05/31/21  9:45 PM   Specimen: Nasopharyngeal Swab; Nasopharyngeal(NP) swabs in vial transport medium  Result Value Ref Range Status   SARS Coronavirus 2 by RT PCR NEGATIVE NEGATIVE Final    Comment: (NOTE) SARS-CoV-2 target nucleic acids are NOT DETECTED.  The SARS-CoV-2 RNA is generally detectable in upper respiratory specimens during the acute phase of infection. The lowest concentration of SARS-CoV-2 viral copies this assay can detect is 138 copies/mL. A negative result does not preclude SARS-Cov-2 infection and should not be used as the sole basis for treatment or other patient management decisions. A negative result may occur with  improper specimen collection/handling, submission of specimen other than nasopharyngeal swab, presence of viral mutation(s) within the areas targeted by this assay, and inadequate number of viral copies(<138 copies/mL). A negative result must be combined with clinical observations, patient history, and  epidemiological information. The expected result is Negative.  Fact Sheet for Patients:  BloggerCourse.comhttps://www.fda.gov/media/152166/download  Fact Sheet for Healthcare Providers:  SeriousBroker.ithttps://www.fda.gov/media/152162/download  This test is no t yet approved or cleared by the [place] FDA and  has been authorized for detection and/or diagnosis of SARS-CoV-2 by FDA under an Emergency Use Authorization (EUA). This EUA will remain  in effect (meaning this test can be used) for the duration of the COVID-19 declaration under Section 564(b)(1) of the Act, 21 U.S.C.section 360bbb-3(b)(1), unless the authorization is terminated  or revoked sooner.       Influenza A by PCR NEGATIVE NEGATIVE Final   Influenza B by PCR NEGATIVE NEGATIVE Final    Comment: (NOTE) The Xpert Xpress SARS-CoV-2/FLU/RSV plus assay is intended as an aid in the diagnosis of influenza from Nasopharyngeal swab specimens and should not be used as a sole basis for treatment. Nasal washings and aspirates are unacceptable for Xpert Xpress SARS-CoV-2/FLU/RSV testing.  Fact Sheet for Patients: BloggerCourse.comhttps://www.fda.gov/media/152166/download  Fact Sheet for Healthcare Providers: SeriousBroker.ithttps://www.fda.gov/media/152162/download  This test is not yet approved or cleared by the [place] FDA and has been authorized for detection and/or diagnosis of SARS-CoV-2 by FDA under an Emergency Use Authorization (EUA). This EUA will remain in effect (meaning this test can be used) for the duration of the COVID-19 declaration under Section 564(b)(1) of the Act, 21 U.S.C. section 360bbb-3(b)(1), unless the authorization is terminated or revoked.  Performed at [facility], [address] [place], [place] [id number]      Labs: BNP (last 3 results) No results for input(s): BNP in the last 8760 hours. Basic Metabolic Panel: Recent Labs  Lab 05/31/21 2105 06/01/21 0426 06/01/21 0621  NA 138 137  --   K 3.3* 3.2*  --   CL 101 102  --   CO2 26  25  --   GLUCOSE 115* 101*  --   BUN 5* 5*  --   CREATININE 0.47* 0.44*  --   CALCIUM 8.3* 8.1*  --   MG 1.0*  --  2.5*  PHOS  --  2.4*  --    Liver Function Tests: Recent Labs  Lab 05/31/21 2105 06/01/21 0426  AST 107* 98*  ALT 94* 81*  ALKPHOS 72 66  BILITOT 1.3* 1.3*  PROT 7.7 7.3  ALBUMIN 4.4 4.1   No  results for input(s): LIPASE, AMYLASE in the last 168 hours. No results for input(s): AMMONIA in the last 168 hours. CBC: Recent Labs  Lab 05/31/21 2105  WBC 4.2  NEUTROABS 2.9  HGB 14.0  HCT 39.3  MCV 92.5  PLT 116*   Cardiac Enzymes: No results for input(s): CKTOTAL, CKMB, CKMBINDEX, TROPONINI in the last 168 hours. BNP: Invalid input(s): POCBNP CBG: Recent Labs  Lab 06/01/21 0155 06/01/21 1111  GLUCAP 100* 143*   D-Dimer No results for input(s): DDIMER in the last 72 hours. Hgb A1c No results for input(s): HGBA1C in the last 72 hours. Lipid Profile No results for input(s): CHOL, HDL, LDLCALC, TRIG, CHOLHDL, LDLDIRECT in the last 72 hours. Thyroid function studies No results for input(s): TSH, T4TOTAL, T3FREE, THYROIDAB in the last 72 hours.  Invalid input(s): FREET3 Anemia work up No results for input(s): VITAMINB12, FOLATE, FERRITIN, TIBC, IRON, RETICCTPCT in the last 72 hours. Urinalysis No results found for: COLORURINE, APPEARANCEUR, LABSPEC, PHURINE, GLUCOSEU, HGBUR, BILIRUBINUR, KETONESUR, PROTEINUR, UROBILINOGEN, NITRITE, LEUKOCYTESUR Sepsis Labs Invalid input(s): PROCALCITONIN,  WBC,  LACTICIDVEN Microbiology Recent Results (from the past 240 hour(s))  Resp Panel by RT-PCR (Flu A&B, Covid) Nasopharyngeal Swab     Status: None   Collection Time: 05/31/21  9:45 PM   Specimen: Nasopharyngeal Swab; Nasopharyngeal(NP) swabs in vial transport medium  Result Value Ref Range Status   SARS Coronavirus 2 by RT PCR NEGATIVE NEGATIVE Final    Comment: (NOTE) SARS-CoV-2 target nucleic acids are NOT DETECTED.  The SARS-CoV-2 RNA is generally  detectable in upper respiratory specimens during the acute phase of infection. The lowest concentration of SARS-CoV-2 viral copies this assay can detect is 138 copies/mL. A negative result does not preclude SARS-Cov-2 infection and should not be used as the sole basis for treatment or other patient management decisions. A negative result may occur with  improper specimen collection/handling, submission of specimen other than nasopharyngeal swab, presence of viral mutation(s) within the areas targeted by this assay, and inadequate number of viral copies(<138 copies/mL). A negative result must be combined with clinical observations, patient history, and epidemiological information. The expected result is Negative.  Fact Sheet for Patients:  BloggerCourse.com  Fact Sheet for Healthcare Providers:  SeriousBroker.it  This test is no t yet approved or cleared by the [place] FDA and  has been authorized for detection and/or diagnosis of SARS-CoV-2 by FDA under an Emergency Use Authorization (EUA). This EUA will remain  in effect (meaning this test can be used) for the duration of the COVID-19 declaration under Section 564(b)(1) of the Act, 21 U.S.C.section 360bbb-3(b)(1), unless the authorization is terminated  or revoked sooner.       Influenza A by PCR NEGATIVE NEGATIVE Final   Influenza B by PCR NEGATIVE NEGATIVE Final    Comment: (NOTE) The Xpert Xpress SARS-CoV-2/FLU/RSV plus assay is intended as an aid in the diagnosis of influenza from Nasopharyngeal swab specimens and should not be used as a sole basis for treatment. Nasal washings and aspirates are unacceptable for Xpert Xpress SARS-CoV-2/FLU/RSV testing.  Fact Sheet for Patients: BloggerCourse.com  Fact Sheet for Healthcare Providers: SeriousBroker.it  This test is not yet approved or cleared by the [place] FDA  and has been authorized for detection and/or diagnosis of SARS-CoV-2 by FDA under an Emergency Use Authorization (EUA). This EUA will remain in effect (meaning this test can be used) for the duration of the COVID-19 declaration under Section 564(b)(1) of the Act, 21 U.S.C. section 360bbb-3(b)(1), unless the  authorization is terminated or revoked.  Performed at [facility], [address] [place], [place] [address]    Time coordinating discharge:   SIGNED:  Standley Dakins, MD  [facility] 06/01/2021, 11:19 AM How to contact the [facility] Attending or Consulting provider 7A - 7P or covering provider during after hours 7P -7A, for this patient?  Check the care team in [facility] and look for a) attending/consulting [facility] provider listed and b) the [facility] team listed Log into [email or web address] and use [place]'s universal password to access. If you do not have the password, please contact the hospital operator. Locate the [facility] provider you are looking for under [facility] and page to a number that you can be directly reached. If you still have difficulty reaching the provider, please page the [facility] (Director on Call) for the Hospitalists listed on amion for assistance.

## 2021-06-01 NOTE — Progress Notes (Signed)
Patient's son Stanley Salazar at bedside for discharge instructions. Verbalized understanding well. All belongings accounted for.   Scripts given to family in hand.

## 2021-06-01 NOTE — Discharge Instructions (Addendum)
Please make appointment to follow up with Dr. Franky Macho in 2 weeks.   Please return to ER to have staples removed in 7-10 days or go to urgent care or primary care clinic to have them removed.       IMPORTANT INFORMATION: PAY CLOSE ATTENTION   PHYSICIAN DISCHARGE INSTRUCTIONS  Follow with Primary care provider  Pcp, No  and other consultants as instructed by your Hospitalist Physician  SEEK MEDICAL CARE OR RETURN TO EMERGENCY ROOM IF SYMPTOMS COME BACK, WORSEN OR NEW PROBLEM DEVELOPS   Please note: You were cared for by a hospitalist during your hospital stay. Every effort will be made to forward records to your primary care provider.  You can request that your primary care provider send for your hospital records if they have not received them.  Once you are discharged, your primary care physician will handle any further medical issues. Please note that NO REFILLS for any discharge medications will be authorized once you are discharged, as it is imperative that you return to your primary care physician (or establish a relationship with a primary care physician if you do not have one) for your post hospital discharge needs so that they can reassess your need for medications and monitor your lab values.  Please get a complete blood count and chemistry panel checked by your Primary MD at your next visit, and again as instructed by your Primary MD.  Get Medicines reviewed and adjusted: Please take all your medications with you for your next visit with your Primary MD  Laboratory/radiological data: Please request your Primary MD to go over all hospital tests and procedure/radiological results at the follow up, please ask your primary care provider to get all Hospital records sent to his/her office.  In some cases, they will be blood work, cultures and biopsy results pending at the time of your discharge. Please request that your primary care provider follow up on these results.  If you are  diabetic, please bring your blood sugar readings with you to your follow up appointment with primary care.    Please call and make your follow up appointments as soon as possible.    Also Note the following: If you experience worsening of your admission symptoms, develop shortness of breath, life threatening emergency, suicidal or homicidal thoughts you must seek medical attention immediately by calling 911 or calling your MD immediately  if symptoms less severe.  You must read complete instructions/literature along with all the possible adverse reactions/side effects for all the Medicines you take and that have been prescribed to you. Take any new Medicines after you have completely understood and accpet all the possible adverse reactions/side effects.   Do not drive when taking Pain medications or sleeping medications (Benzodiazepines)  Do not take more than prescribed Pain, Sleep and Anxiety Medications. It is not advisable to combine anxiety,sleep and pain medications without talking with your primary care practitioner  Special Instructions: If you have smoked or chewed Tobacco  in the last 2 yrs please stop smoking, stop any regular Alcohol  and or any Recreational drug use.  Wear Seat belts while driving.  Do not drive if taking any narcotic, mind altering or controlled substances or recreational drugs or alcohol.

## 2022-11-27 IMAGING — CT CT CERVICAL SPINE W/O CM
3 of 4 series · 12 of 33 positions shown, 14 images · non-contrast
Comparison: None.

CLINICAL DATA: Neck trauma, dangerous injury mechanism (Age
16-64y). Fall.

EXAM:
CT CERVICAL SPINE WITHOUT CONTRAST
TECHNIQUE: Multidetector CT imaging of the cervical spine was performed without
intravenous contrast. Multiplanar CT image reconstructions were also
generated.

[Series 5: sagittal bone · sagittal · 0.22mm/px · 5 of 46 slices shown, 6 images]
[im 16/46  bone]
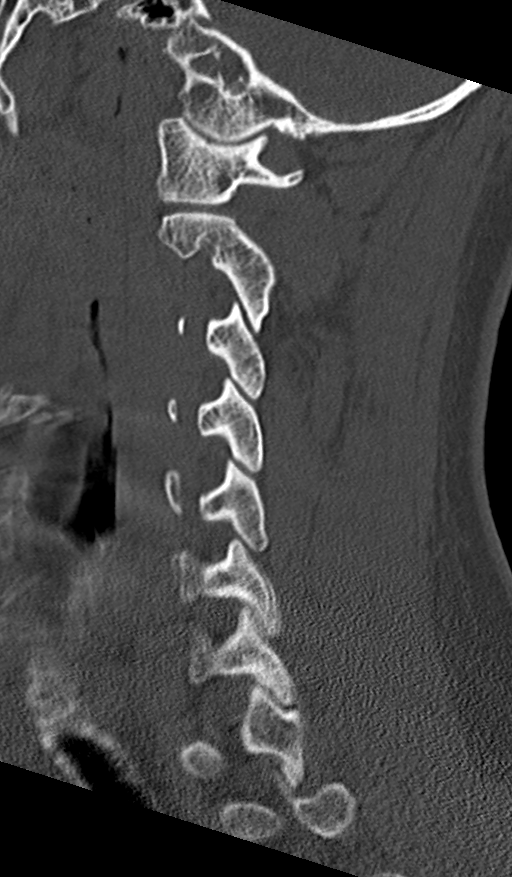
[im 19/46  bone]
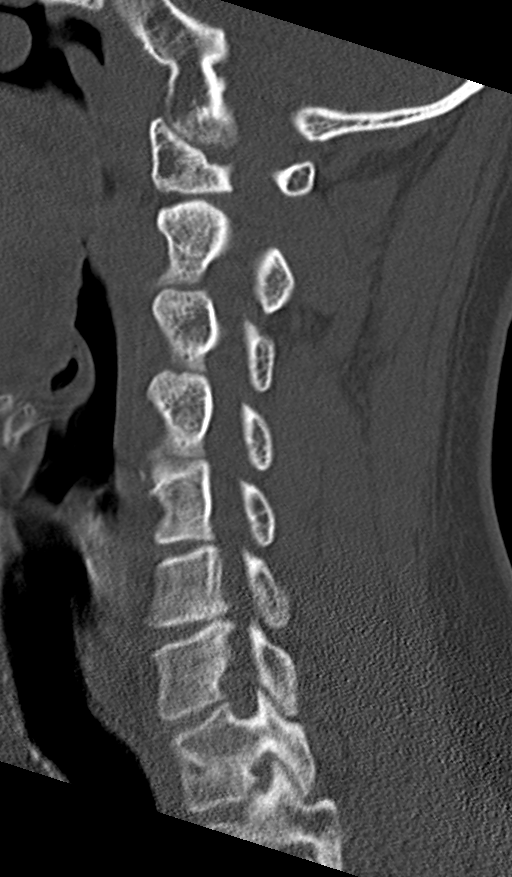
[im 23/46  soft-tissue]
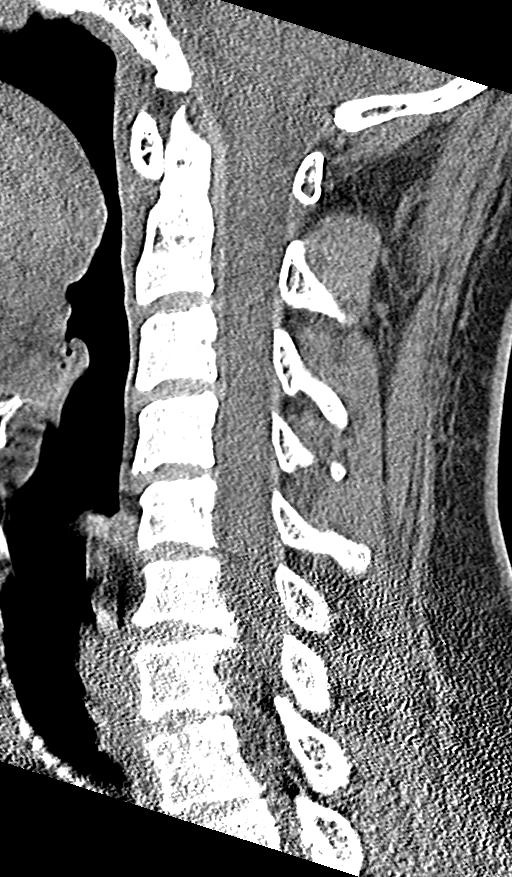
[im 23/46  bone]
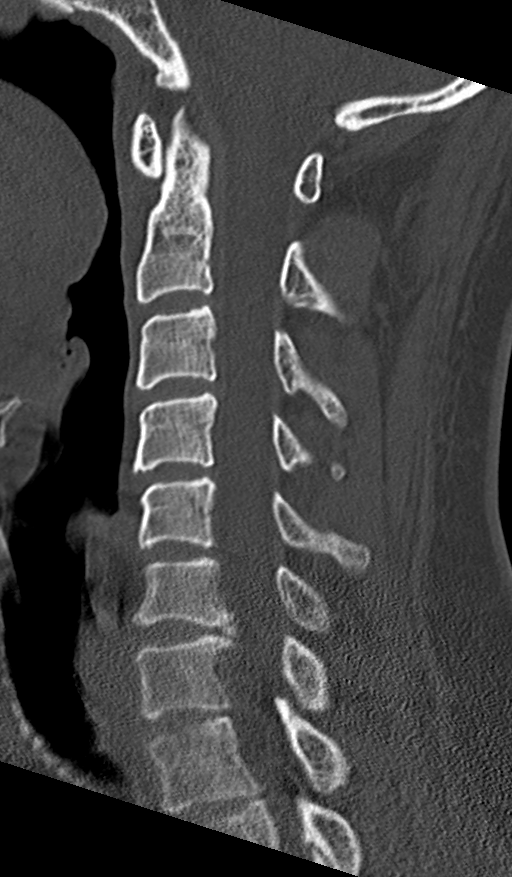
[im 27/46  bone]
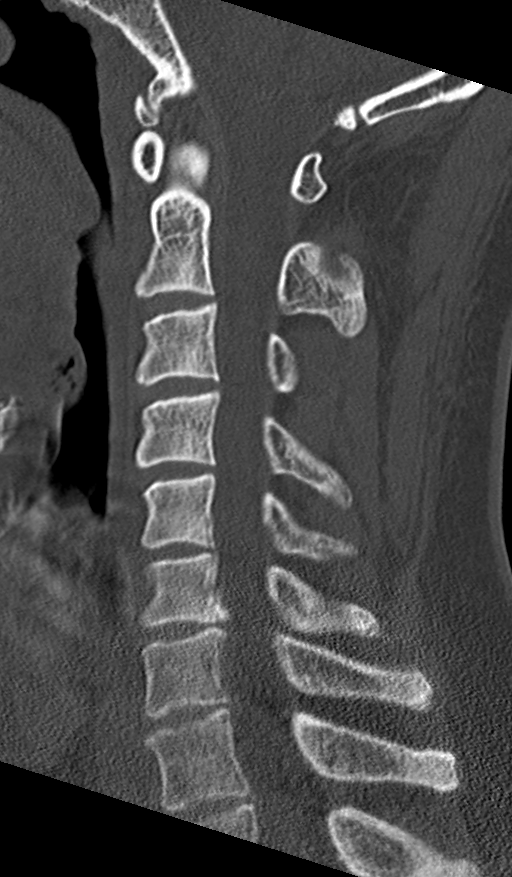
[im 31/46  bone]
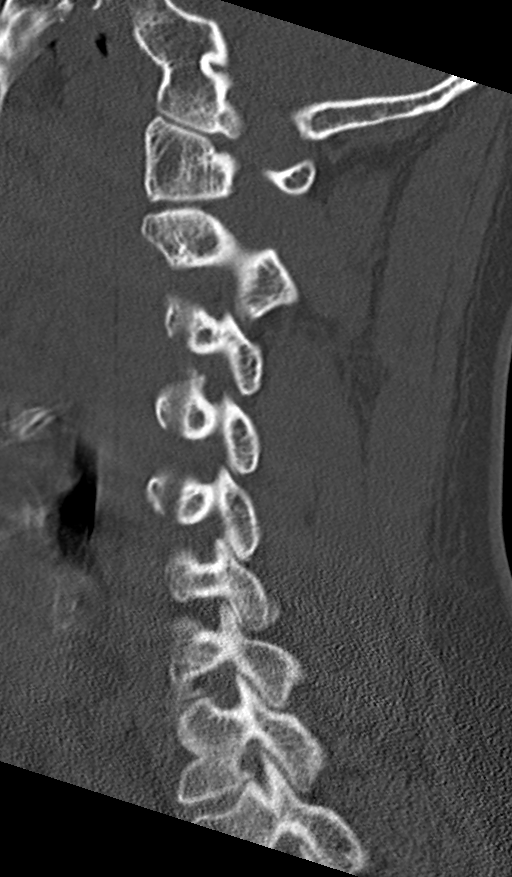

[Series 6: coronal bone · coronal · 0.22mm/px · 3 of 47 slices shown]
[im 10/47  bone]
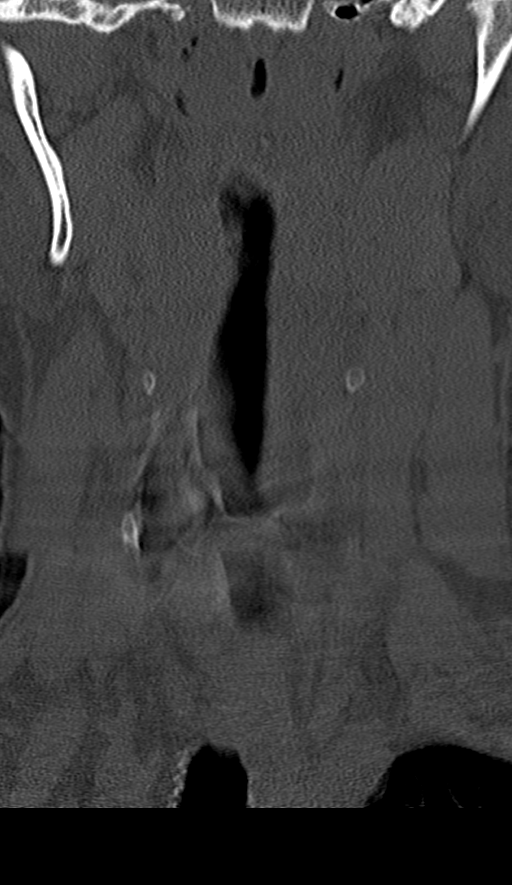
[im 19/47  bone]
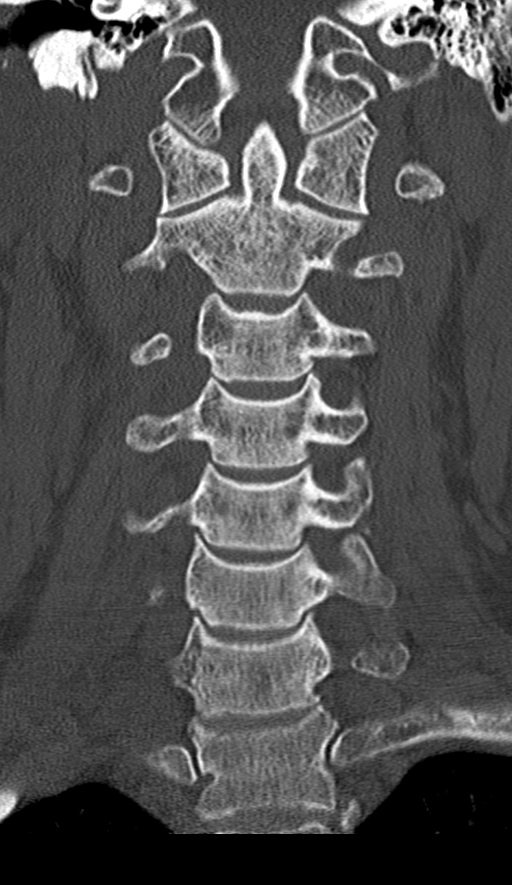
[im 28/47  bone]
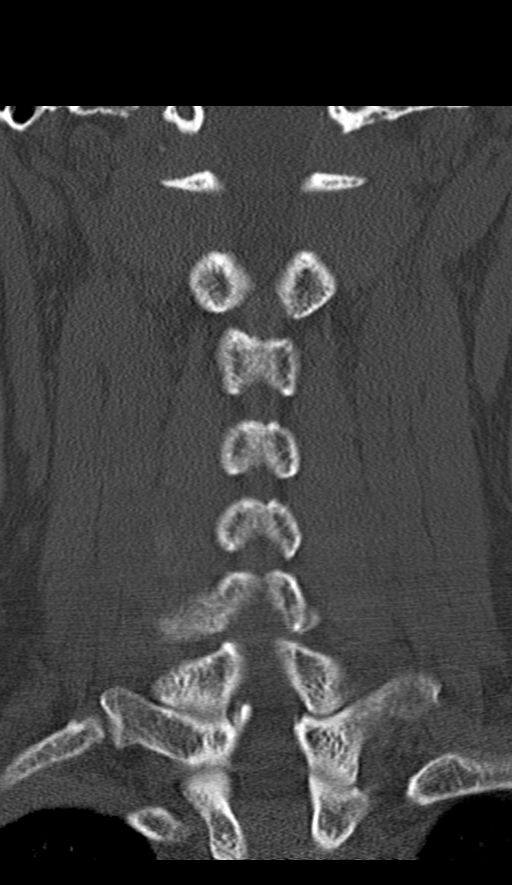

[Series 7: orthogonal axials · axial · 0.21mm/px · z∈[-237,-131]mm · 4 of 85 slices shown, 5 images]
[im 15/85  soft-tissue]
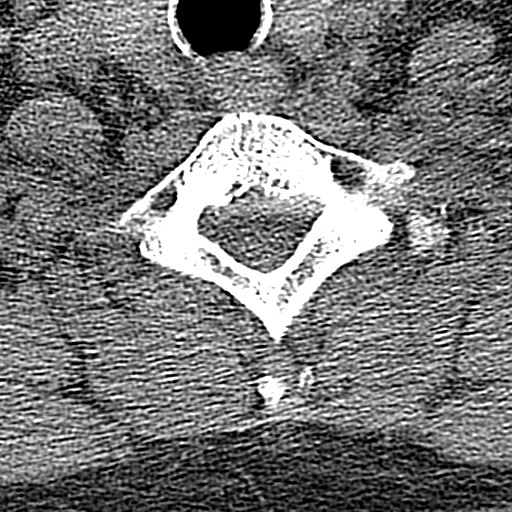
[im 15/85  bone]
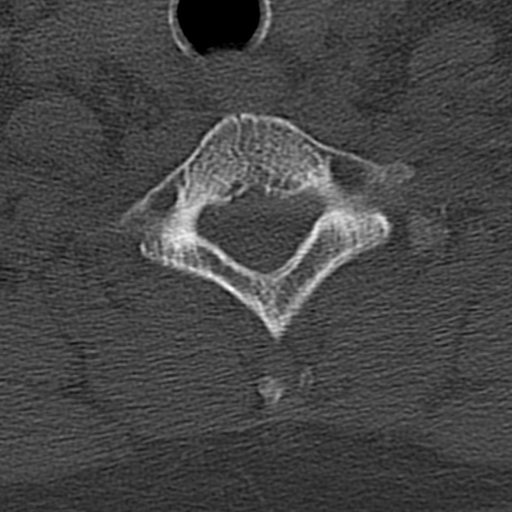
[im 29/85  bone]
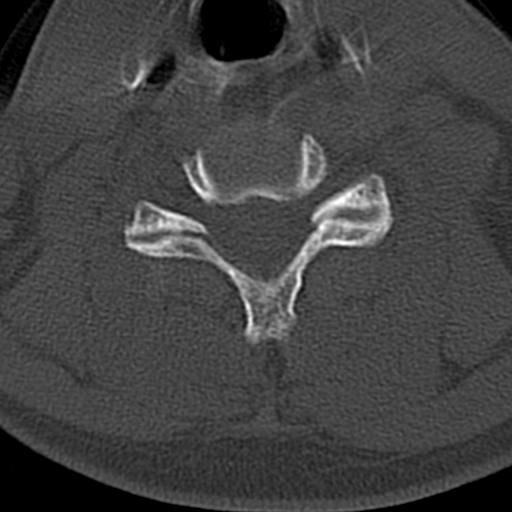
[im 57/85  bone]
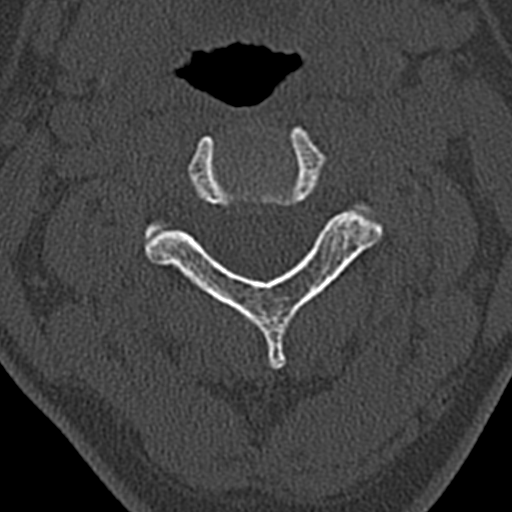
[im 71/85  bone]
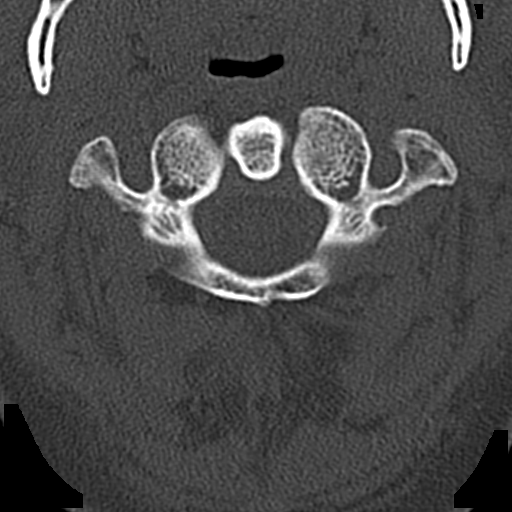

[12 of 33 positions shown; findings below may reference images not displayed]

FINDINGS: Alignment: Normal

Skull base and vertebrae: No acute fracture. No primary bone lesion
or focal pathologic process.

Soft tissues and spinal canal: No prevertebral fluid or swelling. No
visible canal hematoma.

Disc levels:  Normal

Upper chest: Negative

Other: None
IMPRESSION: No bony abnormality.
